# Patient Record
Sex: Female | Born: 1955 | Race: Black or African American | Hispanic: No | Marital: Married | Smoking: Current every day smoker
Health system: Southern US, Community
[De-identification: ages and names within clinical notes are randomized; demographics above are authoritative.]

## PROBLEM LIST (undated history)

## (undated) DIAGNOSIS — I10 Essential (primary) hypertension: Secondary | ICD-10-CM

## (undated) HISTORY — PX: ABDOMINAL HYSTERECTOMY: SHX81

---

## 2012-04-15 ENCOUNTER — Emergency Department (HOSPITAL_BASED_OUTPATIENT_CLINIC_OR_DEPARTMENT_OTHER)
Admission: EM | Admit: 2012-04-15 | Discharge: 2012-04-15 | Discharge status: Against Medical Advice | Payer: 59 | Attending: Emergency Medicine | Admitting: Emergency Medicine

## 2012-04-15 ENCOUNTER — Emergency Department
Admission: EM | Admit: 2012-04-15 | Discharge: 2012-04-15 | Disposition: A | Discharge status: Home / Self Care | Payer: 59 | Source: Home / Self Care

## 2012-04-15 ENCOUNTER — Emergency Department (INDEPENDENT_AMBULATORY_CARE_PROVIDER_SITE_OTHER): Payer: 59

## 2012-04-15 DIAGNOSIS — J449 Chronic obstructive pulmonary disease, unspecified: Secondary | ICD-10-CM

## 2012-04-15 DIAGNOSIS — R0989 Other specified symptoms and signs involving the circulatory and respiratory systems: Secondary | ICD-10-CM | POA: Insufficient documentation

## 2012-04-15 DIAGNOSIS — R062 Wheezing: Secondary | ICD-10-CM

## 2012-04-15 DIAGNOSIS — R918 Other nonspecific abnormal finding of lung field: Secondary | ICD-10-CM

## 2012-04-15 DIAGNOSIS — R059 Cough, unspecified: Secondary | ICD-10-CM

## 2012-04-15 DIAGNOSIS — R05 Cough: Secondary | ICD-10-CM

## 2012-04-15 DIAGNOSIS — J069 Acute upper respiratory infection, unspecified: Secondary | ICD-10-CM

## 2012-04-15 HISTORY — DX: Essential (primary) hypertension: I10

## 2012-04-15 MED ORDER — METHYLPREDNISOLONE SODIUM SUCC 125 MG IJ SOLR
125.0000 mg | Freq: Once | INTRAMUSCULAR | Status: AC
  Administered 2012-04-15: 125 mg via INTRAMUSCULAR

## 2012-04-15 MED ORDER — PREDNISONE 50 MG PO TABS: ORAL_TABLET | ORAL | Status: AC

## 2012-04-15 MED ORDER — DOXYCYCLINE HYCLATE 100 MG PO TBEC: 100.0000 mg | DELAYED_RELEASE_TABLET | Freq: Two times a day (BID) | ORAL | Status: AC

## 2012-04-15 NOTE — ED Notes (Signed)
Informed by registration that pt had left, that she had to leave to go to work.

## 2012-04-15 NOTE — ED Notes (Addendum)
Called and spoke to Dr. Benancio Deeds.  He related that he wanted to have BUN and Creatinine drawn and spiral CT to rule out PE.  There were no orders sent but asked for [place] to try to call pt at home to see if she would return for outpatient labs and CT.

## 2012-04-15 NOTE — ED Notes (Signed)
Started with cold x 4 days now with wheezing and shortness of breath

## 2012-04-15 NOTE — ED Notes (Addendum)
Pt states that she was sent from [facility] for CT scan.  No orders received.

## 2012-04-15 NOTE — ED Provider Notes (Signed)
History     CSN: [id number]  Arrival date & time 04/15/12  1204   First MD Initiated Contact with Patient 04/15/12 1206      Chief Complaint  Patient presents with  . URI   HPI URI Symptoms Onset: 1 week  Description: cough, increased WOB, SOB, wheezing, rhinorrhea, nasal congestion Modifying factors:  40 pack year smoker. No formal dx of COPD   Symptoms Nasal discharge: yes Fever: no Sore throat: no Cough: yes Wheezing: yes Ear pain: no GI symptoms: no Sick contacts: no  Red Flags  Stiff neck: no Dyspnea: exertional  Rash: no Swallowing difficulty: no  Sinusitis Risk Factors Headache/face pain: no Double sickening: no tooth pain: no  Allergy Risk Factors Sneezing: no Itchy scratchy throat: no Seasonal symptoms: no  Flu Risk Factors Headache: no muscle aches: no severe fatigue: no   Past Medical History  Diagnosis Date  . Hypertension     Past Surgical History  Procedure Date  . Abdominal hysterectomy     No family history on file.  History  Substance Use Topics  . Smoking status: Current Everyday Smoker  . Smokeless tobacco: Not on file  . Alcohol Use: No    OB History    Grav Para Term Preterm Abortions TAB SAB Ect Mult Living                  Review of Systems  All other systems reviewed and are negative.    Allergies  Penicillins  Home Medications   Current Outpatient Rx  Name Route Sig Dispense Refill  . DOXYCYCLINE HYCLATE 100 MG PO TBEC Oral Take 1 tablet (100 mg total) by mouth 2 (two) times daily. 14 tablet 0  . PREDNISONE 50 MG PO TABS  1 tab daily x 5 days 5 tablet 0    BP 148/99  Pulse 92  Temp 98.1 F (36.7 C) (Oral)  Resp 20  Ht 5\' 3"  (1.6 m)  Wt 188 lb (85.276 kg)  BMI 33.30 kg/m2  SpO2 97%  Physical Exam  Constitutional: She appears well-developed and well-nourished.  HENT:  Head: Normocephalic and atraumatic.  Eyes: Conjunctivae are normal. Pupils are equal, round, and reactive to light.  Neck:  Normal range of motion. Neck supple.  Cardiovascular: Normal rate and regular rhythm.   Pulmonary/Chest: Effort normal and breath sounds normal.       + faint end expiratory wheezes diffusely    Abdominal: Soft. Bowel sounds are normal.  Neurological: She is alert.  Skin: Skin is warm and dry.    ED Course  Procedures (including critical care time)  Labs Reviewed - No data to display Dg Chest 2 View  04/15/2012  *RADIOLOGY REPORT*  Clinical Data: Cough, wheezing evaluate for infiltrate  CHEST - 2 VIEW  Comparison: None.  Findings: Cardiomediastinal silhouette is unremarkable.  There is right base medially atelectasis or infiltrate.  No pulmonary edema. Bony thorax is unremarkable.  IMPRESSION: Right base medially atelectasis or infiltrate.  No pulmonary edema.  Original Report Authenticated By: Natasha Mead, M.D.     No diagnosis found.    MDM  Likely obstructive lung disease flare. Viral induced.  Solumedrol 125mg  IM x1.  Prednisone and doxy.  Respiratory status is relatively stable, however given smoking status, near tachycardia and noted infiltrate on CXR, will send pt for CTA to rule out more extensive intrapulmonary disease vs. PE.  Wells score 0-3. Depending priority of PE on differential, though with very few risk factors.  Discussed infectious and resp red flags at length.      The patient and/or caregiver has been counseled thoroughly with regard to treatment plan and/or medications prescribed including dosage, schedule, interactions, rationale for use, and possible side effects and they verbalize understanding. Diagnoses and expected course of recovery discussed and will return if not improved as expected or if the condition worsens. Patient and/or caregiver verbalized understanding.             Floydene Flock, MD 04/15/12 1345  Floydene Flock, MD 04/18/12 [phone]

## 2012-04-16 NOTE — ED Notes (Signed)
Spoke to pt today regarding follow up CT scan.  Pt will come in tomorrow afternoon.   Telephone Orders per Dr. Benancio Deeds given to Adam in CT.

## 2012-04-16 NOTE — ED Notes (Signed)
Pt requested for [place] to call urgent care in [place] regarding orders for xrays.  Pt did not want to have a bill for ED when she was seen this am by a MD.

## 2012-04-17 ENCOUNTER — Other Ambulatory Visit: Payer: Self-pay | Admitting: Family Medicine

## 2012-04-17 ENCOUNTER — Ambulatory Visit (HOSPITAL_COMMUNITY): Payer: 59

## 2012-04-17 ENCOUNTER — Ambulatory Visit (HOSPITAL_BASED_OUTPATIENT_CLINIC_OR_DEPARTMENT_OTHER)
Admission: RE | Admit: 2012-04-17 | Discharge: 2012-04-17 | Disposition: A | Discharge status: Home / Self Care | Payer: 59 | Source: Ambulatory Visit | Attending: Family Medicine | Admitting: Family Medicine

## 2012-04-17 DIAGNOSIS — R0602 Shortness of breath: Secondary | ICD-10-CM | POA: Insufficient documentation

## 2012-04-17 MED ORDER — IOHEXOL 350 MG/ML SOLN
80.0000 mL | Freq: Once | INTRAVENOUS | Status: AC | PRN
  Administered 2012-04-17: 80 mL via INTRAVENOUS

## 2012-04-19 ENCOUNTER — Telehealth: Payer: Self-pay | Admitting: *Deleted

## 2012-04-19 NOTE — ED Notes (Addendum)
Left message to call back for CT angio results.   Called left message for pt with CT results, f/u with PCP, and call back if she has any questions or concerns.

## 2012-04-20 NOTE — ED Provider Notes (Signed)
Agree with exam, assessment, and plan.   Lattie Haw, MD 04/20/12 [phone]

## 2018-08-20 ENCOUNTER — Emergency Department (INDEPENDENT_AMBULATORY_CARE_PROVIDER_SITE_OTHER): Payer: 59

## 2018-08-20 ENCOUNTER — Encounter: Payer: Self-pay | Admitting: Emergency Medicine

## 2018-08-20 ENCOUNTER — Emergency Department (INDEPENDENT_AMBULATORY_CARE_PROVIDER_SITE_OTHER)
Admission: EM | Admit: 2018-08-20 | Discharge: 2018-08-20 | Disposition: A | Discharge status: Home / Self Care | Payer: 59 | Source: Home / Self Care | Attending: Emergency Medicine | Admitting: Emergency Medicine

## 2018-08-20 ENCOUNTER — Other Ambulatory Visit: Payer: Self-pay

## 2018-08-20 DIAGNOSIS — J441 Chronic obstructive pulmonary disease with (acute) exacerbation: Secondary | ICD-10-CM | POA: Diagnosis not present

## 2018-08-20 DIAGNOSIS — R05 Cough: Secondary | ICD-10-CM | POA: Diagnosis not present

## 2018-08-20 DIAGNOSIS — R062 Wheezing: Secondary | ICD-10-CM | POA: Diagnosis not present

## 2018-08-20 DIAGNOSIS — I517 Cardiomegaly: Secondary | ICD-10-CM | POA: Diagnosis not present

## 2018-08-20 DIAGNOSIS — R058 Other specified cough: Secondary | ICD-10-CM

## 2018-08-20 MED ORDER — AZITHROMYCIN 250 MG PO TABS: ORAL_TABLET | ORAL | 0 refills | Status: AC

## 2018-08-20 MED ORDER — LISINOPRIL-HYDROCHLOROTHIAZIDE 20-25 MG PO TABS: 1.0000 | ORAL_TABLET | Freq: Every day | ORAL | 0 refills | Status: AC

## 2018-08-20 MED ORDER — PREDNISONE 10 MG (21) PO TBPK: ORAL_TABLET | ORAL | 0 refills | Status: AC

## 2018-08-20 MED ORDER — METHYLPREDNISOLONE ACETATE 80 MG/ML IJ SUSP
80.0000 mg | Freq: Once | INTRAMUSCULAR | Status: AC
  Administered 2018-08-20: 80 mg via INTRAMUSCULAR

## 2018-08-20 MED ORDER — BENZONATATE 200 MG PO CAPS: ORAL_CAPSULE | ORAL | 0 refills | Status: AC

## 2018-08-20 NOTE — ED Triage Notes (Signed)
Here with worsening productive cough. Started 3 weeks ago; no improvement taking otc cold/cough meds. Hx smoker thick, white phlegm noted. Tachycardic 124/ denies chest pain.

## 2018-08-20 NOTE — ED Provider Notes (Addendum)
[facility]    CSN: [id number] Arrival date & time: 08/20/18  1126     History   Chief Complaint Chief Complaint  Patient presents with  . Cough  . URI    HPI Erica Meyer is a 62 y.o. female.   HPI Here with worsening productive cough. Started 3 weeks ago; no improvement taking otc cold/cough meds.  Hx smoker/COPD.   thick, white/yellow phlegm noted. denies chest pain.  No chills/sweats + mild Fever  +  Mild Nasal congestion No  Discolored Post-nasal drainage No sinus pain/pressure No sore throat  +  cough + mild wheezing + chest congestion No hemoptysis No shortness of breath No pleuritic pain. Denies palpitations or chest pain.  No itchy/red eyes No earache  No nausea No vomiting No abdominal pain No diarrhea  No skin rashes +  MildFatigue No myalgias No headache   Past Medical History:  Diagnosis Date  . 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 History of COPD/smoking.   OB History   None      Home Medications    Prior to Admission medications   Medication Sig Start Date End Date Taking? Authorizing Provider  azithromycin (ZITHROMAX Z-PAK) 250 MG tablet Take 2 tablets on day one, then 1 tablet daily on days 2 through 5 08/20/18   Lajean Manes, MD  benzonatate (TESSALON) 200 MG capsule Take 1 every 8 hours as needed for cough. 08/20/18   Lajean Manes, MD  lisinopril-hydrochlorothiazide (PRINZIDE,ZESTORETIC) 20-25 MG tablet Take 1 tablet by mouth daily. For blood pressure.  Any further refills would need to be done by PCP. 08/20/18   Lajean Manes, MD  predniSONE (STERAPRED UNI-PAK 21 TAB) 10 MG (21) TBPK tablet Starting Monday.--Take 6 on day 1, then 5 on day 2, 4 on day 3, then 3 tablets on day 4, then 2 tablets on day 5, then 1 on day 6. 08/20/18   Lajean Manes, MD    Family History Family History  Problem  Relation Age of Onset  . Hypertension Mother   . Diabetes Mother   . Glaucoma Mother   . Diabetes Father     Social History Social History   Tobacco Use  . Smoking status: Current Every Day Smoker  Substance Use Topics  . Alcohol use: No  . Drug use: No     Allergies   Penicillins   Review of Systems Review of Systems  Constitutional: Positive for fatigue and fever. Negative for chills and diaphoresis.  HENT: Negative for ear pain and voice change.   Genitourinary: Negative.   Skin: Negative for rash.  Neurological: Negative for seizures and syncope.  Psychiatric/Behavioral: Negative for confusion.  All other systems reviewed and are negative.    Physical Exam Triage Vital Signs ED Triage Vitals  Enc Vitals Group     BP 08/20/18 1143 (!) 161/98     Pulse Rate 08/20/18 1143 (!) 128     Resp --      Temp 08/20/18 1143 98.4 F (36.9 C)     Temp Source 08/20/18 1143 Oral     SpO2 08/20/18 1143 97 %     Weight 08/20/18 1144 168 lb (76.2 kg)     Height 08/20/18 1144 5\' 3"  (1.6 m)     Head Circumference --      Peak Flow --      Pain Score 08/20/18  1144 0     Pain Loc --      Pain Edu? --      Excl. in GC? --    No data found.  Updated Vital Signs BP (!) 147/87 (BP Location: Left Arm)   Pulse (!) 121   Temp 98.4 F (36.9 C) (Oral)   Ht 5\' 3"  (1.6 m)   Wt 76.2 kg   SpO2 97%   BMI 29.76 kg/m    Physical Exam  Constitutional: She is oriented to person, place, and time. She appears well-developed and well-nourished. No distress.  HENT:  Head: Normocephalic and atraumatic.  Right Ear: Tympanic membrane normal.  Left Ear: Tympanic membrane normal.  Nose: Nose normal.  Mouth/Throat: Oropharynx is clear and moist. No oropharyngeal exudate.  Eyes: Right eye exhibits no discharge. Left eye exhibits no discharge. No scleral icterus.  Neck: Neck supple.  Cardiovascular: Normal rate, regular rhythm and normal heart sounds.  Pulmonary/Chest: No respiratory  distress. She has wheezes (Mild, late expiratory). She has rhonchi. She has no rales.  Lymphadenopathy:    She has no cervical adenopathy.  Neurological: She is alert and oriented to person, place, and time.  Skin: Skin is warm and dry. Capillary refill takes less than 2 seconds. No rash noted.  Psychiatric: She has a normal mood and affect.  Nursing note and vitals reviewed.    UC Treatments / Results  Labs (all labs ordered are listed, but only abnormal results are displayed) Labs Reviewed - No data to display  EKG None  Radiology Dg Chest 2 View  Result Date: 08/20/2018 CLINICAL DATA:  Productive cough and wheezing, lymphoma EXAM: CHEST - 2 VIEW COMPARISON:  12/24/2013 chest CT FINDINGS: Left subclavian Port-A-Cath terminates at the cavoatrial junction. Mild cardiomegaly. Minimally tortuous thoracic aorta. Otherwise normal mediastinal contour. No pneumothorax. No pleural effusion. Lungs appear clear, with no acute consolidative airspace disease and no pulmonary edema. IMPRESSION: Mild cardiomegaly without pulmonary edema. No active pulmonary disease. Electronically Signed   By: Delbert Phenix M.D.   On: 08/20/2018 13:32    Procedures Procedures (including critical care time)  Medications Ordered in UC Medications  methylPREDNISolone acetate (DEPO-MEDROL) injection 80 mg (80 mg Intramuscular Given 08/20/18 1236)    Initial Impression / Assessment and Plan / UC Course  I have reviewed the triage vital signs and the nursing notes.  Pertinent labs & imaging results that were available during my care of the patient were reviewed by me and considered in my medical decision making (see chart for details).  Clinical Course as of Aug 21 832  Wynelle Link Aug 20, 2018  1233 Patient evaluated initially.  Severe rhonchi and some mild wheezing. Stat chest x-ray ordered. Order Depo-Medrol 80 mg IM stat. I am holding off on any nebulizer treatment, given her pulse 120, regular, consistent with  sinus tachycardia.-We will reevaluate pulse and BP after the chest x-ray and Depo-Medrol administered.   [DM]    Clinical Course User Index [DM] Lajean Manes, MD    Patient reevaluated.  Pulse 88, regular.  BP by me 145/83.  Respiratory rate 16.  She had minimal wheezes and diffuse rhonchi. Reviewed chest x-ray report with patient.  Showed questionable mild cardiomegaly, but no evidence of CHF or increased vascular congestion.  No infiltrates. On physical exam today, she has no leg edema or leg tenderness or S3 or sign of DVT or CHF clinically. She states she had recently taken a decongestant prior to coming to our office, which may  have accounted for her elevated pulse rate initially.-I considered giving her nebulizer bronchodilator, but I felt the risks outweighed the benefits, given her high pulse rate initially. Her diagnosis is likely acute bronchitis and exacerbation of COPD.  Clinically, no evidence of CHF or pneumonia.  Vital signs stable, pulse ox 97% room air. Treatment options discussed, as well as risks, benefits, alternatives. Patient voiced understanding and agreement with the following plans: Depo-Medrol 80 mg IM stat. Prescribed azithromycin for bacterial coverage and prednisone 6-day Dosepak. Other symptomatic care.  Verbal and written instructions given. Explained the importance of following up with PCP within 1 week, sooner if worse or new symptoms. Red flags discussed.  Precautions discussed.  Go to emergency room if any red flag or worsening symptoms. I also urged her to follow-up with her oncologist to follow-up her prior follicular lymphoma.  Advised to quit smoking. Advised to avoid decongestants which can elevate pulse. She voiced understanding and agreement. Final Clinical Impressions(s) / UC Diagnoses   Final diagnoses:  Wheezing  Productive cough  COPD exacerbation [facility](HCC)    ED Prescriptions    Medication Sig Dispense Auth. Provider   azithromycin  (ZITHROMAX Z-PAK) 250 MG tablet Take 2 tablets on day one, then 1 tablet daily on days 2 through 5 1 each Lajean ManesMassey, David, MD   benzonatate (TESSALON) 200 MG capsule Take 1 every 8 hours as needed for cough. 20 capsule Lajean ManesMassey, David, MD   predniSONE (STERAPRED UNI-PAK 21 TAB) 10 MG (21) TBPK tablet Starting Monday.--Take 6 on day 1, then 5 on day 2, 4 on day 3, then 3 tablets on day 4, then 2 tablets on day 5, then 1 on day 6. 21 tablet Lajean ManesMassey, David, MD   lisinopril-hydrochlorothiazide (PRINZIDE,ZESTORETIC) 20-25 MG tablet Take 1 tablet by mouth daily. For blood pressure.  Any further refills would need to be done by PCP. 14 tablet Lajean ManesMassey, David, MD     Regarding her hypertension, she states she had run out of her lisinopril/HCTZ, and I agree to (this 1 time) refill 14 days, but explained she would need to follow-up with PCP and have PCP prescribed and manage her hypertension.  She voiced understanding.       Lajean ManesMassey, David, MD 08/21/18 [phone]

## 2019-05-03 IMAGING — DX DG CHEST 2V
2 series · 2 of 2 positions shown · non-contrast
Comparison: 12/24/2013 chest CT

CLINICAL DATA: Productive cough and wheezing, lymphoma

EXAM:
CHEST - 2 VIEW

[chest pa]
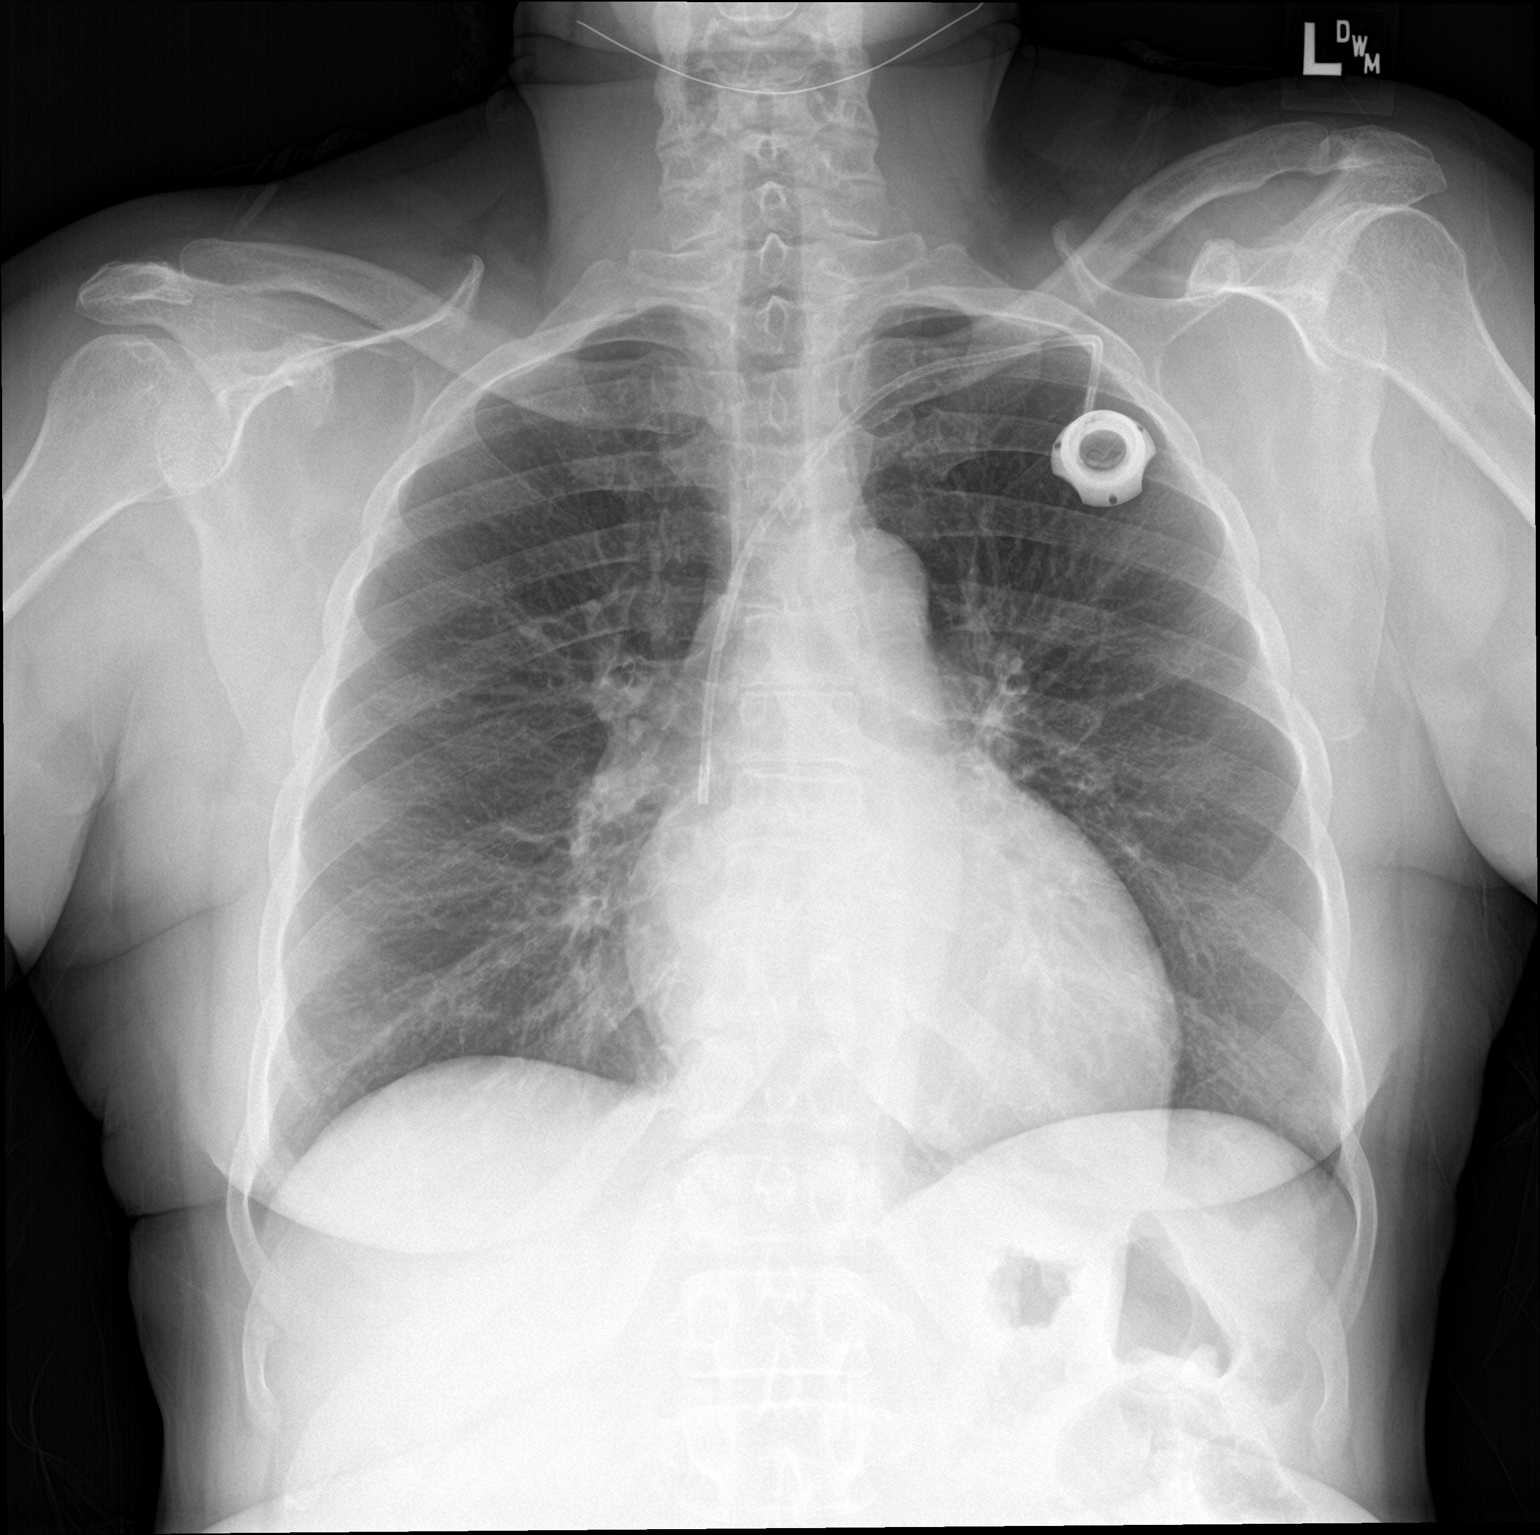

[chest lat]
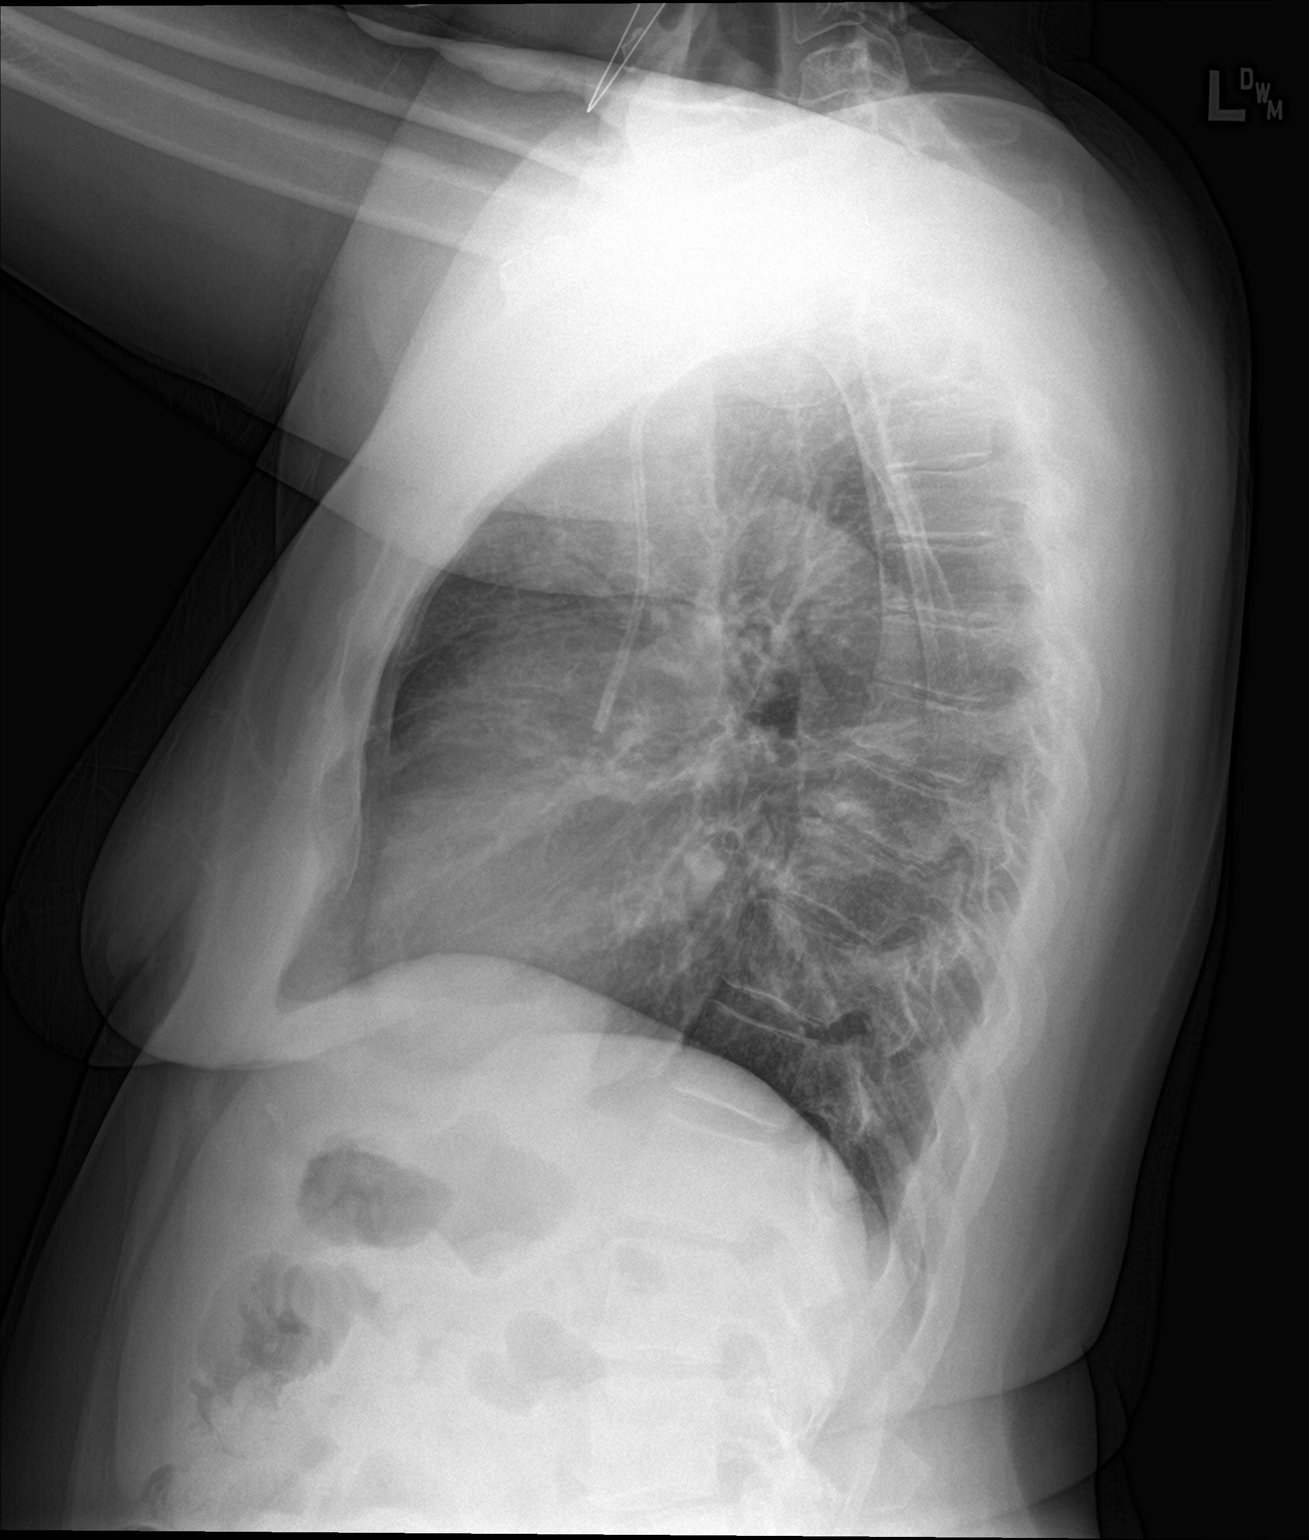

[2 of 2 positions shown; findings below may reference images not displayed]

FINDINGS: Left subclavian Port-A-Cath terminates at the cavoatrial junction.
Mild cardiomegaly. Minimally tortuous thoracic aorta. Otherwise
normal mediastinal contour. No pneumothorax. No pleural effusion.
Lungs appear clear, with no acute consolidative airspace disease and
no pulmonary edema.
IMPRESSION: Mild cardiomegaly without pulmonary edema. No active pulmonary
disease.
